# Patient Record
Sex: Female | Born: 1970 | Race: White | Hispanic: No | Marital: Married | Smoking: Never smoker
Health system: Southern US, Community
[De-identification: ages and names within clinical notes are randomized; demographics above are authoritative.]

## PROBLEM LIST (undated history)

## (undated) DIAGNOSIS — K8020 Calculus of gallbladder without cholecystitis without obstruction: Secondary | ICD-10-CM

## (undated) DIAGNOSIS — K219 Gastro-esophageal reflux disease without esophagitis: Secondary | ICD-10-CM

## (undated) HISTORY — PX: COLONOSCOPY: SHX174

## (undated) HISTORY — PX: OTHER SURGICAL HISTORY: SHX169

## (undated) HISTORY — PX: DILATION AND CURETTAGE OF UTERUS: SHX78

---

## 2017-08-04 DIAGNOSIS — D2262 Melanocytic nevi of left upper limb, including shoulder: Secondary | ICD-10-CM | POA: Diagnosis not present

## 2017-08-04 DIAGNOSIS — L814 Other melanin hyperpigmentation: Secondary | ICD-10-CM | POA: Diagnosis not present

## 2017-08-04 DIAGNOSIS — D225 Melanocytic nevi of trunk: Secondary | ICD-10-CM | POA: Diagnosis not present

## 2017-08-04 DIAGNOSIS — D2261 Melanocytic nevi of right upper limb, including shoulder: Secondary | ICD-10-CM | POA: Diagnosis not present

## 2018-05-15 DIAGNOSIS — H04123 Dry eye syndrome of bilateral lacrimal glands: Secondary | ICD-10-CM | POA: Diagnosis not present

## 2018-12-11 DIAGNOSIS — Z113 Encounter for screening for infections with a predominantly sexual mode of transmission: Secondary | ICD-10-CM | POA: Diagnosis not present

## 2018-12-11 DIAGNOSIS — N393 Stress incontinence (female) (male): Secondary | ICD-10-CM | POA: Diagnosis not present

## 2018-12-11 DIAGNOSIS — Z124 Encounter for screening for malignant neoplasm of cervix: Secondary | ICD-10-CM | POA: Diagnosis not present

## 2018-12-11 DIAGNOSIS — Z01419 Encounter for gynecological examination (general) (routine) without abnormal findings: Secondary | ICD-10-CM | POA: Diagnosis not present

## 2018-12-11 DIAGNOSIS — Z1151 Encounter for screening for human papillomavirus (HPV): Secondary | ICD-10-CM | POA: Diagnosis not present

## 2018-12-11 DIAGNOSIS — Z6824 Body mass index (BMI) 24.0-24.9, adult: Secondary | ICD-10-CM | POA: Diagnosis not present

## 2018-12-26 DIAGNOSIS — N393 Stress incontinence (female) (male): Secondary | ICD-10-CM | POA: Diagnosis not present

## 2018-12-26 DIAGNOSIS — M6289 Other specified disorders of muscle: Secondary | ICD-10-CM | POA: Diagnosis not present

## 2018-12-26 DIAGNOSIS — M6281 Muscle weakness (generalized): Secondary | ICD-10-CM | POA: Diagnosis not present

## 2018-12-26 DIAGNOSIS — N3941 Urge incontinence: Secondary | ICD-10-CM | POA: Diagnosis not present

## 2019-01-01 DIAGNOSIS — Z0000 Encounter for general adult medical examination without abnormal findings: Secondary | ICD-10-CM | POA: Diagnosis not present

## 2019-01-01 DIAGNOSIS — Z130 Encounter for screening for diseases of the blood and blood-forming organs and certain disorders involving the immune mechanism: Secondary | ICD-10-CM | POA: Diagnosis not present

## 2019-01-01 DIAGNOSIS — R1013 Epigastric pain: Secondary | ICD-10-CM | POA: Diagnosis not present

## 2019-01-01 DIAGNOSIS — Z13220 Encounter for screening for lipoid disorders: Secondary | ICD-10-CM | POA: Diagnosis not present

## 2019-01-01 DIAGNOSIS — Z1329 Encounter for screening for other suspected endocrine disorder: Secondary | ICD-10-CM | POA: Diagnosis not present

## 2019-01-01 DIAGNOSIS — Z131 Encounter for screening for diabetes mellitus: Secondary | ICD-10-CM | POA: Diagnosis not present

## 2019-01-03 DIAGNOSIS — M62838 Other muscle spasm: Secondary | ICD-10-CM | POA: Diagnosis not present

## 2019-01-03 DIAGNOSIS — M6281 Muscle weakness (generalized): Secondary | ICD-10-CM | POA: Diagnosis not present

## 2019-01-03 DIAGNOSIS — N3946 Mixed incontinence: Secondary | ICD-10-CM | POA: Diagnosis not present

## 2019-01-03 DIAGNOSIS — M6289 Other specified disorders of muscle: Secondary | ICD-10-CM | POA: Diagnosis not present

## 2019-01-09 DIAGNOSIS — M6281 Muscle weakness (generalized): Secondary | ICD-10-CM | POA: Diagnosis not present

## 2019-01-09 DIAGNOSIS — M62838 Other muscle spasm: Secondary | ICD-10-CM | POA: Diagnosis not present

## 2019-01-09 DIAGNOSIS — N3946 Mixed incontinence: Secondary | ICD-10-CM | POA: Diagnosis not present

## 2019-01-09 DIAGNOSIS — M6289 Other specified disorders of muscle: Secondary | ICD-10-CM | POA: Diagnosis not present

## 2019-01-17 DIAGNOSIS — M6289 Other specified disorders of muscle: Secondary | ICD-10-CM | POA: Diagnosis not present

## 2019-01-17 DIAGNOSIS — M62838 Other muscle spasm: Secondary | ICD-10-CM | POA: Diagnosis not present

## 2019-01-17 DIAGNOSIS — N3946 Mixed incontinence: Secondary | ICD-10-CM | POA: Diagnosis not present

## 2019-01-17 DIAGNOSIS — M6281 Muscle weakness (generalized): Secondary | ICD-10-CM | POA: Diagnosis not present

## 2019-02-07 DIAGNOSIS — N3946 Mixed incontinence: Secondary | ICD-10-CM | POA: Diagnosis not present

## 2019-02-07 DIAGNOSIS — M6281 Muscle weakness (generalized): Secondary | ICD-10-CM | POA: Diagnosis not present

## 2019-02-07 DIAGNOSIS — M62838 Other muscle spasm: Secondary | ICD-10-CM | POA: Diagnosis not present

## 2019-02-07 DIAGNOSIS — M6289 Other specified disorders of muscle: Secondary | ICD-10-CM | POA: Diagnosis not present

## 2019-03-05 DIAGNOSIS — Z309 Encounter for contraceptive management, unspecified: Secondary | ICD-10-CM | POA: Diagnosis not present

## 2019-07-19 ENCOUNTER — Other Ambulatory Visit: Payer: Self-pay

## 2019-07-19 DIAGNOSIS — Z20822 Contact with and (suspected) exposure to covid-19: Secondary | ICD-10-CM

## 2019-07-21 LAB — NOVEL CORONAVIRUS, NAA: SARS-CoV-2, NAA: NOT DETECTED

## 2020-03-06 ENCOUNTER — Other Ambulatory Visit: Payer: Self-pay | Admitting: Gastroenterology

## 2020-03-06 DIAGNOSIS — R1011 Right upper quadrant pain: Secondary | ICD-10-CM

## 2020-03-20 ENCOUNTER — Other Ambulatory Visit: Payer: Self-pay

## 2020-03-20 ENCOUNTER — Ambulatory Visit (HOSPITAL_COMMUNITY)
Admission: RE | Admit: 2020-03-20 | Discharge: 2020-03-20 | Disposition: A | Discharge status: Home / Self Care | Payer: 59 | Source: Ambulatory Visit | Attending: Gastroenterology | Admitting: Gastroenterology

## 2020-03-20 ENCOUNTER — Encounter (HOSPITAL_COMMUNITY): Admission: RE | Admit: 2020-03-20 | Payer: 59 | Source: Ambulatory Visit

## 2020-03-20 DIAGNOSIS — R1011 Right upper quadrant pain: Secondary | ICD-10-CM | POA: Insufficient documentation

## 2020-05-06 ENCOUNTER — Other Ambulatory Visit: Payer: Self-pay | Admitting: Surgery

## 2020-06-17 ENCOUNTER — Encounter (HOSPITAL_BASED_OUTPATIENT_CLINIC_OR_DEPARTMENT_OTHER): Payer: Self-pay | Admitting: Surgery

## 2020-06-17 ENCOUNTER — Other Ambulatory Visit: Payer: Self-pay

## 2020-06-19 ENCOUNTER — Other Ambulatory Visit (HOSPITAL_COMMUNITY)
Admission: RE | Admit: 2020-06-19 | Discharge: 2020-06-19 | Disposition: A | Discharge status: Home / Self Care | Payer: 59 | Source: Ambulatory Visit | Attending: Surgery | Admitting: Surgery

## 2020-06-19 DIAGNOSIS — Z20822 Contact with and (suspected) exposure to covid-19: Secondary | ICD-10-CM | POA: Insufficient documentation

## 2020-06-19 DIAGNOSIS — Z01812 Encounter for preprocedural laboratory examination: Secondary | ICD-10-CM | POA: Diagnosis present

## 2020-06-19 LAB — SARS CORONAVIRUS 2 (TAT 6-24 HRS): SARS Coronavirus 2: NEGATIVE

## 2020-06-19 MED ORDER — ENSURE PRE-SURGERY PO LIQD: 296.0000 mL | Freq: Once | ORAL | Status: DC

## 2020-06-19 NOTE — Progress Notes (Signed)

## 2020-06-22 NOTE — H&P (Signed)
   Gwenyth Allegra  Location: [facility] Patient #: [id number] DOB: 09/22/1970 Married / Language: English / Race: White Female   History of Present Illness  The patient is a 49 year old female who presents for evaluation of gall stones.  Chief complaint: Epigastric abdominal pain with symptomatic gallstones  This is a 49 year old female referred by Dr. Loreta Ave for symptomatic cholelithiasis. She has seen Dr. Loreta Ave for screening colonoscopy as she has just turned 49. On further questioning, she's been having epigastric abdominal pain radiated to the back with nausea and vomiting on occasion over the past year. She underwent an ultrasound showing multiple gallstones with the largest being 1.5 cm. The bile duct was normal. She has since had a negative upper and lower endoscopy. She reports attacks will last occasion several hours. Balance of otherwise been normal. She denies jaundice. She is otherwise healthy.   Past Surgical History No pertinent past surgical history   Diagnostic Studies History Colonoscopy  within last year Mammogram  within last year  Allergies  No Known Drug Allergies  [05/06/2020]: Allergies Reconciled   Medication History  Junel FE 1/20 (1-20MG -MCG Tablet, Oral) Active. Medications Reconciled  Social History  Caffeine use  Tea.  Family History Colon Polyps  Father.  Pregnancy / Birth History Gravida  2 Irregular periods  Length (months) of breastfeeding  7-12 Maternal age  34-35 Para  1  Other Problems  Cholelithiasis     Review of Systems  Breast Not Present- Breast Mass, Breast Pain, Nipple Discharge and Skin Changes. Cardiovascular Not Present- Chest Pain, Difficulty Breathing Lying Down, Leg Cramps, Palpitations, Rapid Heart Rate, Shortness of Breath and Swelling of Extremities. Gastrointestinal Not Present- Abdominal Pain, Bloating, Bloody Stool, Change in Bowel Habits, Chronic diarrhea, Constipation,  Difficulty Swallowing, Excessive gas, Gets full quickly at meals, Hemorrhoids, Indigestion, Nausea, Rectal Pain and Vomiting. Female Genitourinary Not Present- Frequency, Nocturia, Painful Urination, Pelvic Pain and Urgency. Musculoskeletal Not Present- Back Pain, Joint Pain, Joint Stiffness, Muscle Pain, Muscle Weakness and Swelling of Extremities. Psychiatric Not Present- Anxiety, Bipolar, Change in Sleep Pattern, Depression, Fearful and Frequent crying. Hematology Not Present- Blood Thinners, Easy Bruising, Excessive bleeding, Gland problems, HIV and Persistent Infections.  Vitals   Weight: 133.4 lb Height: 62in Body Surface Area: 1.61 m Body Mass Index: 24.4 kg/m  Temp.: 98.64F (Tympanic)  Pulse: 121 (Regular)  BP: 122/72(Sitting, Left Arm, Standard)     Physical Exam The physical exam findings are as follows: Note: Today, she appears well  Her abdomen is soft and nontender. There are no hernias. There is no hepatomegaly.    Assessment & Plan  SYMPTOMATIC CHOLELITHIASIS (K80.20)  Impression: I reviewed her notes from Dr. Loreta Ave. I reviewed her laboratory data and ultrasound. She does have multiple gallstones and her symptoms are consistent with symptomatic cholelithiasis. I diagnosis with her. A laparoscopic cholecystectomy is recommended. I discussed the procedure in detail. The patient was given Agricultural engineer. We discussed the risks and benefits of a laparoscopic cholecystectomy and possible cholangiogram including, but not limited to, bleeding, infection, injury to surrounding structures such as the intestine or liver, bile leak, retained gallstones, need to convert to an open procedure, prolonged diarrhea, blood clots such as DVT, common bile duct injury, anesthesia risks, and possible need for additional procedures. The likelihood of improvement in symptoms and return to the patient's normal status is good. We discussed the typical post-operative recovery  course. All questions were answered.

## 2020-06-23 ENCOUNTER — Ambulatory Visit (HOSPITAL_BASED_OUTPATIENT_CLINIC_OR_DEPARTMENT_OTHER): Payer: 59 | Admitting: Certified Registered"

## 2020-06-23 ENCOUNTER — Encounter (HOSPITAL_BASED_OUTPATIENT_CLINIC_OR_DEPARTMENT_OTHER): Payer: Self-pay | Admitting: Surgery

## 2020-06-23 ENCOUNTER — Encounter (HOSPITAL_BASED_OUTPATIENT_CLINIC_OR_DEPARTMENT_OTHER)
Admission: RE | Disposition: A | Discharge status: Home / Self Care | Payer: Self-pay | Source: Home / Self Care | Attending: Surgery

## 2020-06-23 ENCOUNTER — Ambulatory Visit (HOSPITAL_BASED_OUTPATIENT_CLINIC_OR_DEPARTMENT_OTHER)
Admission: RE | Admit: 2020-06-23 | Discharge: 2020-06-23 | Disposition: A | Discharge status: Home / Self Care | Payer: 59 | Attending: Surgery | Admitting: Surgery

## 2020-06-23 ENCOUNTER — Other Ambulatory Visit: Payer: Self-pay

## 2020-06-23 DIAGNOSIS — K8010 Calculus of gallbladder with chronic cholecystitis without obstruction: Secondary | ICD-10-CM | POA: Insufficient documentation

## 2020-06-23 HISTORY — DX: Gastro-esophageal reflux disease without esophagitis: K21.9

## 2020-06-23 HISTORY — PX: CHOLECYSTECTOMY: SHX55

## 2020-06-23 HISTORY — DX: Calculus of gallbladder without cholecystitis without obstruction: K80.20

## 2020-06-23 LAB — POCT PREGNANCY, URINE: Preg Test, Ur: NEGATIVE

## 2020-06-23 SURGERY — LAPAROSCOPIC CHOLECYSTECTOMY
Anesthesia: General | Site: Abdomen

## 2020-06-23 MED ORDER — DEXAMETHASONE SODIUM PHOSPHATE 10 MG/ML IJ SOLN
INTRAMUSCULAR | Status: AC
  Filled 2020-06-23: qty 1

## 2020-06-23 MED ORDER — HYDROMORPHONE HCL 1 MG/ML IJ SOLN
INTRAMUSCULAR | Status: AC
  Filled 2020-06-23: qty 0.5

## 2020-06-23 MED ORDER — ROCURONIUM BROMIDE 100 MG/10ML IV SOLN
INTRAVENOUS | Status: DC | PRN
  Administered 2020-06-23: 60 mg via INTRAVENOUS

## 2020-06-23 MED ORDER — LIDOCAINE 2% (20 MG/ML) 5 ML SYRINGE
INTRAMUSCULAR | Status: AC
  Filled 2020-06-23: qty 5

## 2020-06-23 MED ORDER — PROMETHAZINE HCL 25 MG/ML IJ SOLN: 6.2500 mg | INTRAMUSCULAR | Status: DC | PRN

## 2020-06-23 MED ORDER — MIDAZOLAM HCL 5 MG/5ML IJ SOLN
INTRAMUSCULAR | Status: DC | PRN
  Administered 2020-06-23: 2 mg via INTRAVENOUS

## 2020-06-23 MED ORDER — GABAPENTIN 300 MG PO CAPS
300.0000 mg | ORAL_CAPSULE | ORAL | Status: AC
  Administered 2020-06-23: 300 mg via ORAL

## 2020-06-23 MED ORDER — SUGAMMADEX SODIUM 500 MG/5ML IV SOLN
INTRAVENOUS | Status: AC
  Filled 2020-06-23: qty 5

## 2020-06-23 MED ORDER — ONDANSETRON HCL 4 MG/2ML IJ SOLN
INTRAMUSCULAR | Status: AC
  Filled 2020-06-23: qty 2

## 2020-06-23 MED ORDER — 0.9 % SODIUM CHLORIDE (POUR BTL) OPTIME
TOPICAL | Status: DC | PRN
  Administered 2020-06-23: 1000 mL

## 2020-06-23 MED ORDER — OXYCODONE HCL 5 MG/5ML PO SOLN: 5.0000 mg | Freq: Once | ORAL | Status: DC | PRN

## 2020-06-23 MED ORDER — CHLORHEXIDINE GLUCONATE CLOTH 2 % EX PADS: 6.0000 | MEDICATED_PAD | Freq: Once | CUTANEOUS | Status: DC

## 2020-06-23 MED ORDER — MEPERIDINE HCL 25 MG/ML IJ SOLN: 6.2500 mg | INTRAMUSCULAR | Status: DC | PRN

## 2020-06-23 MED ORDER — ACETAMINOPHEN 500 MG PO TABS
1000.0000 mg | ORAL_TABLET | ORAL | Status: AC
  Administered 2020-06-23: 1000 mg via ORAL

## 2020-06-23 MED ORDER — SUGAMMADEX SODIUM 200 MG/2ML IV SOLN
INTRAVENOUS | Status: DC | PRN
  Administered 2020-06-23: 250 mg via INTRAVENOUS

## 2020-06-23 MED ORDER — AMISULPRIDE (ANTIEMETIC) 5 MG/2ML IV SOLN: 10.0000 mg | Freq: Once | INTRAVENOUS | Status: DC | PRN

## 2020-06-23 MED ORDER — ONDANSETRON HCL 4 MG/2ML IJ SOLN
INTRAMUSCULAR | Status: DC | PRN
  Administered 2020-06-23: 4 mg via INTRAVENOUS

## 2020-06-23 MED ORDER — SODIUM CHLORIDE 0.9 % IR SOLN
Status: DC | PRN
  Administered 2020-06-23: 1000 mL

## 2020-06-23 MED ORDER — PHENYLEPHRINE HCL (PRESSORS) 10 MG/ML IV SOLN
INTRAVENOUS | Status: DC | PRN
  Administered 2020-06-23 (×2): 80 ug via INTRAVENOUS

## 2020-06-23 MED ORDER — PHENYLEPHRINE 40 MCG/ML (10ML) SYRINGE FOR IV PUSH (FOR BLOOD PRESSURE SUPPORT)
PREFILLED_SYRINGE | INTRAVENOUS | Status: AC
  Filled 2020-06-23: qty 10

## 2020-06-23 MED ORDER — LIDOCAINE 2% (20 MG/ML) 5 ML SYRINGE
INTRAMUSCULAR | Status: DC | PRN
  Administered 2020-06-23: 60 mg via INTRAVENOUS

## 2020-06-23 MED ORDER — GABAPENTIN 300 MG PO CAPS
ORAL_CAPSULE | ORAL | Status: AC
  Filled 2020-06-23: qty 1

## 2020-06-23 MED ORDER — PROPOFOL 10 MG/ML IV BOLUS
INTRAVENOUS | Status: DC | PRN
  Administered 2020-06-23: 120 mg via INTRAVENOUS

## 2020-06-23 MED ORDER — DEXAMETHASONE SODIUM PHOSPHATE 10 MG/ML IJ SOLN
INTRAMUSCULAR | Status: DC | PRN
  Administered 2020-06-23: 5 mg via INTRAVENOUS

## 2020-06-23 MED ORDER — CELECOXIB 200 MG PO CAPS
ORAL_CAPSULE | ORAL | Status: AC
  Filled 2020-06-23: qty 2

## 2020-06-23 MED ORDER — FENTANYL CITRATE (PF) 100 MCG/2ML IJ SOLN
INTRAMUSCULAR | Status: AC
  Filled 2020-06-23: qty 2

## 2020-06-23 MED ORDER — FENTANYL CITRATE (PF) 100 MCG/2ML IJ SOLN
INTRAMUSCULAR | Status: DC | PRN
  Administered 2020-06-23: 100 ug via INTRAVENOUS
  Administered 2020-06-23: 50 ug via INTRAVENOUS
  Administered 2020-06-23 (×2): 25 ug via INTRAVENOUS

## 2020-06-23 MED ORDER — CEFAZOLIN SODIUM-DEXTROSE 2-4 GM/100ML-% IV SOLN
INTRAVENOUS | Status: AC
  Filled 2020-06-23: qty 100

## 2020-06-23 MED ORDER — OXYCODONE HCL 5 MG PO TABS: 5.0000 mg | ORAL_TABLET | Freq: Once | ORAL | Status: DC | PRN

## 2020-06-23 MED ORDER — CEFAZOLIN SODIUM-DEXTROSE 2-4 GM/100ML-% IV SOLN
2.0000 g | INTRAVENOUS | Status: AC
  Administered 2020-06-23: 2 g via INTRAVENOUS

## 2020-06-23 MED ORDER — ROCURONIUM BROMIDE 10 MG/ML (PF) SYRINGE
PREFILLED_SYRINGE | INTRAVENOUS | Status: AC
  Filled 2020-06-23: qty 10

## 2020-06-23 MED ORDER — LACTATED RINGERS IV SOLN: INTRAVENOUS | Status: DC

## 2020-06-23 MED ORDER — CELECOXIB 200 MG PO CAPS
400.0000 mg | ORAL_CAPSULE | ORAL | Status: AC
  Administered 2020-06-23: 400 mg via ORAL

## 2020-06-23 MED ORDER — HYDROMORPHONE HCL 1 MG/ML IJ SOLN
0.2500 mg | INTRAMUSCULAR | Status: DC | PRN
  Administered 2020-06-23 (×2): 0.5 mg via INTRAVENOUS

## 2020-06-23 MED ORDER — ACETAMINOPHEN 500 MG PO TABS
ORAL_TABLET | ORAL | Status: AC
  Filled 2020-06-23: qty 2

## 2020-06-23 MED ORDER — BUPIVACAINE HCL 0.25 % IJ SOLN
INTRAMUSCULAR | Status: DC | PRN
  Administered 2020-06-23: 20 mL

## 2020-06-23 MED ORDER — OXYCODONE HCL 5 MG PO TABS: 5.0000 mg | ORAL_TABLET | Freq: Four times a day (QID) | ORAL | 0 refills | Status: AC | PRN

## 2020-06-23 MED ORDER — MIDAZOLAM HCL 2 MG/2ML IJ SOLN
INTRAMUSCULAR | Status: AC
  Filled 2020-06-23: qty 2

## 2020-06-23 SURGICAL SUPPLY — 36 items
APPLIER CLIP 5 13 M/L LIGAMAX5 (MISCELLANEOUS) ×3
BLADE CLIPPER SURG (BLADE) IMPLANT
CHLORAPREP W/TINT 26 (MISCELLANEOUS) ×3 IMPLANT
CLIP APPLIE 5 13 M/L LIGAMAX5 (MISCELLANEOUS) ×1 IMPLANT
COVER MAYO STAND STRL (DRAPES) IMPLANT
COVER WAND RF STERILE (DRAPES) IMPLANT
DECANTER SPIKE VIAL GLASS SM (MISCELLANEOUS) ×3 IMPLANT
DERMABOND ADVANCED (GAUZE/BANDAGES/DRESSINGS) ×2
DERMABOND ADVANCED .7 DNX12 (GAUZE/BANDAGES/DRESSINGS) ×1 IMPLANT
DRAPE C-ARM 42X72 X-RAY (DRAPES) IMPLANT
DRAPE LAPAROSCOPIC ABDOMINAL (DRAPES) ×3 IMPLANT
ELECT REM PT RETURN 9FT ADLT (ELECTROSURGICAL) ×3
ELECTRODE REM PT RTRN 9FT ADLT (ELECTROSURGICAL) ×1 IMPLANT
GLOVE SURG SIGNA 7.5 PF LTX (GLOVE) ×3 IMPLANT
GOWN STRL REUS W/ TWL LRG LVL3 (GOWN DISPOSABLE) ×2 IMPLANT
GOWN STRL REUS W/ TWL XL LVL3 (GOWN DISPOSABLE) ×1 IMPLANT
GOWN STRL REUS W/TWL LRG LVL3 (GOWN DISPOSABLE) ×4
GOWN STRL REUS W/TWL XL LVL3 (GOWN DISPOSABLE) ×2
NS IRRIG 1000ML POUR BTL (IV SOLUTION) ×3 IMPLANT
PACK BASIN DAY SURGERY FS (CUSTOM PROCEDURE TRAY) ×3 IMPLANT
POUCH SPECIMEN RETRIEVAL 10MM (ENDOMECHANICALS) ×3 IMPLANT
SCISSORS LAP 5X35 DISP (ENDOMECHANICALS) ×3 IMPLANT
SET CHOLANGIOGRAPH 5 50 .035 (SET/KITS/TRAYS/PACK) IMPLANT
SET IRRIG TUBING LAPAROSCOPIC (IRRIGATION / IRRIGATOR) ×3 IMPLANT
SET TUBE SMOKE EVAC HIGH FLOW (TUBING) ×3 IMPLANT
SLEEVE ENDOPATH XCEL 5M (ENDOMECHANICALS) ×6 IMPLANT
SLEEVE SCD COMPRESS KNEE MED (MISCELLANEOUS) ×3 IMPLANT
SPECIMEN JAR SMALL (MISCELLANEOUS) ×3 IMPLANT
SUT MNCRL AB 4-0 PS2 18 (SUTURE) ×3 IMPLANT
SUT MON AB 4-0 PC3 18 (SUTURE) ×3 IMPLANT
TOWEL GREEN STERILE FF (TOWEL DISPOSABLE) ×3 IMPLANT
TRAY LAPAROSCOPIC (CUSTOM PROCEDURE TRAY) ×3 IMPLANT
TROCAR XCEL BLUNT TIP 100MML (ENDOMECHANICALS) ×3 IMPLANT
TROCAR XCEL NON-BLD 5MMX100MML (ENDOMECHANICALS) ×3 IMPLANT
TUBE CONNECTING 20'X1/4 (TUBING) ×1
TUBE CONNECTING 20X1/4 (TUBING) ×2 IMPLANT

## 2020-06-23 NOTE — Interval H&P Note (Signed)
History and Physical Interval Note: no change in H and P  06/23/2020 8:50 AM  Breanna Hunt  has presented today for surgery, with the diagnosis of SYMPTOMATIC CHOLELITHIASIS.  The various methods of treatment have been discussed with the patient and family. After consideration of risks, benefits and other options for treatment, the patient has consented to  Procedure(s): LAPAROSCOPIC CHOLECYSTECTOMY (N/A) as a surgical intervention.  The patient's history has been reviewed, patient examined, no change in status, stable for surgery.  I have reviewed the patient's chart and labs.  Questions were answered to the patient's satisfaction.     Abigail Miyamoto

## 2020-06-23 NOTE — Transfer of Care (Signed)
Immediate Anesthesia Transfer of Care Note  Patient: Breanna Hunt  Procedure(s) Performed: LAPAROSCOPIC CHOLECYSTECTOMY (N/A Abdomen)  Patient Location: PACU  Anesthesia Type:General  Level of Consciousness: drowsy  Airway & Oxygen Therapy: Patient Spontanous Breathing and Patient connected to face mask oxygen  Post-op Assessment: Report given to RN and Post -op Vital signs reviewed and stable  Post vital signs: Reviewed and stable  Last Vitals:  Vitals Value Taken Time  BP 145/89 06/23/20 1031  Temp    Pulse 81 06/23/20 1033  Resp 24 06/23/20 1033  SpO2 100 % 06/23/20 1033  Vitals shown include unvalidated device data.  Last Pain:  Vitals:   06/23/20 0738  TempSrc:   PainSc: 0-No pain         Complications: No complications documented.

## 2020-06-23 NOTE — Discharge Instructions (Signed)
Next dose of Tylenol and Ibuprofen at 1:45 PM   [facility] ______[facility] LAPAROSCOPIC SURGERY: POST OP INSTRUCTIONS Always review your discharge instruction sheet given to you by the facility where your surgery was performed. IF YOU HAVE DISABILITY OR FAMILY LEAVE FORMS, YOU MUST BRING THEM TO THE OFFICE FOR PROCESSING.   DO NOT GIVE THEM TO YOUR DOCTOR.  1. A prescription for pain medication may be given to you upon discharge.  Take your pain medication as prescribed, if needed.  If narcotic pain medicine is not needed, then you may take acetaminophen (Tylenol) or ibuprofen (Advil) as needed. 2. Take your usually prescribed medications unless otherwise directed. 3. If you need a refill on your pain medication, please contact your pharmacy.  They will contact our office to request authorization. Prescriptions will not be filled after 5pm or on week-ends. 4. You should follow a light diet the first few days after arrival home, such as soup and crackers, etc.  Be sure to include lots of fluids daily. 5. Most patients will experience some swelling and bruising in the area of the incisions.  Ice packs will help.  Swelling and bruising can take several days to resolve.  6. It is common to experience some constipation if taking pain medication after surgery.  Increasing fluid intake and taking a stool softener (such as Colace) will usually help or prevent this problem from occurring.  A mild laxative (Milk of Magnesia or Miralax) should be taken according to package instructions if there are no bowel movements after 48 hours. 7. Unless discharge instructions indicate otherwise, you may remove your bandages 24-48 hours after surgery, and you may shower at that time.  You may have steri-strips (small skin tapes) in place directly over the incision.  These strips should be left on the skin for 7-10 days.  If your surgeon used skin glue on the incision, you may shower in 24 hours.  The glue will  flake off over the next 2-3 weeks.  Any sutures or staples will be removed at the office during your follow-up visit. 8. ACTIVITIES:  You may resume regular (light) daily activities beginning the next day--such as daily self-care, walking, climbing stairs--gradually increasing activities as tolerated.  You may have sexual intercourse when it is comfortable.  Refrain from any heavy lifting or straining until approved by your doctor. a. You may drive when you are no longer taking prescription pain medication, you can comfortably wear a seatbelt, and you can safely maneuver your car and apply brakes. b. RETURN TO WORK:  __________________________________________________________ 9. You should see your doctor in the office for a follow-up appointment approximately 2-3 weeks after your surgery.  Make sure that you call for this appointment within a day or two after you arrive home to insure a convenient appointment time. 10. OTHER INSTRUCTIONS:OK TO SHOWER STARTING TOMORROW 11. ICE PACK, TYLENOL, AND IBUPROFEN ALSO FOR PAIN 12. NO LIFTING MORE THAN 15 TO 20 POUNDS FOR 2 WEEKS __________________________________________________________________________________________________________________________ __________________________________________________________________________________________________________________________ WHEN TO CALL YOUR DOCTOR: 1. Fever over 101.0 2. Inability to urinate 3. Continued bleeding from incision. 4. Increased pain, redness, or drainage from the incision. 5. Increasing abdominal pain  The clinic staff is available to answer your questions during regular business hours.  Please don't hesitate to call and ask to speak to one of the nurses for clinical concerns.  If you have a medical emergency, go to the nearest emergency room or call 911.  A surgeon from [facility]  is always on call at the hospital. [address], [place], [place]  [address] ? [address]  [address], [place], [place]   [address] [phone] ? [phone] ? FAX [phone] Web site: [email or web address]    Post Anesthesia Home Care Instructions  Activity: Get plenty of rest for the remainder of the day. A responsible individual must stay with you for 24 hours following the procedure.  For the next 24 hours, DO NOT: -Drive a car -Advertising copywriter -Drink alcoholic beverages -Take any medication unless instructed by your physician -Make any legal decisions or sign important papers.  Meals: Start with liquid foods such as gelatin or soup. Progress to regular foods as tolerated. Avoid greasy, spicy, heavy foods. If nausea and/or vomiting occur, drink only clear liquids until the nausea and/or vomiting subsides. Call your physician if vomiting continues.  Special Instructions/Symptoms: Your throat may feel dry or sore from the anesthesia or the breathing tube placed in your throat during surgery. If this causes discomfort, gargle with warm salt water. The discomfort should disappear within 24 hours.  If you had a scopolamine patch placed behind your ear for the management of post- operative nausea and/or vomiting:  1. The medication in the patch is effective for 72 hours, after which it should be removed.  Wrap patch in a tissue and discard in the trash. Wash hands thoroughly with soap and water. 2. You may remove the patch earlier than 72 hours if you experience unpleasant side effects which may include dry mouth, dizziness or visual disturbances. 3. Avoid touching the patch. Wash your hands with soap and water after contact with the patch.

## 2020-06-23 NOTE — Op Note (Signed)
Laparoscopic Cholecystectomy Procedure Note  Indications: This patient presents with symptomatic gallbladder disease and will undergo laparoscopic cholecystectomy.  Pre-operative Diagnosis: Symptomatic cholelithiasis   Post-operative Diagnosis: Symptomatic cholelithiasis   Surgeon: Abigail Miyamoto MD  Assistants: Kerin Salen MD [facility]  Anesthesia: General endotracheal anesthesia  ASA Class: 2  Procedure Details  The patient was seen again in the Holding Room. The risks, benefits, complications, treatment options, and expected outcomes were discussed with the patient. The possibilities of reaction to medication, pulmonary aspiration, perforation of viscus, bleeding, recurrent infection, finding a normal gallbladder, the need for additional procedures, failure to diagnose a condition, the possible need to convert to an open procedure, and creating a complication requiring transfusion or operation were discussed with the patient. The likelihood of improving the patient's symptoms with return to their baseline status is good.  The patient and/or family concurred with the proposed plan, giving informed consent. The site of surgery properly noted. The patient was taken to Operating Room, identified as Breanna Hunt and the procedure verified as Laparoscopic Cholecystectomy with Intraoperative Cholangiogram. A Time Out was held and the above information confirmed.  Prior to the induction of general anesthesia, antibiotic prophylaxis was administered. General endotracheal anesthesia was then administered and tolerated well. After the induction, the abdomen was prepped with Chloraprep and draped in sterile fashion. The patient was positioned in the supine position.  Local anesthetic agent was injected into the skin near the umbilicus and an incision made. We dissected down to the abdominal fascia with blunt dissection.  The fascia was incised vertically and we entered the peritoneal cavity  bluntly.  A pursestring suture of 0-Vicryl was placed around the fascial opening.  The Hasson cannula was inserted and secured with the stay suture.  Pneumoperitoneum was then created with CO2 and tolerated well without any adverse changes in the patient's vital signs. A 5-mm port was placed in the subxiphoid position.  Two 5-mm ports were placed in the right upper quadrant. All skin incisions were infiltrated with a local anesthetic agent before making the incision and placing the trocars.   We positioned the patient in reverse Trendelenburg, tilted slightly to the patient's left.  The gallbladder was identified, the fundus grasped and retracted cephalad. Adhesions were lysed bluntly and with the electrocautery where indicated, taking care not to injure any adjacent organs or viscus. The infundibulum was grasped and retracted laterally, exposing the peritoneum overlying the triangle of Calot. This was then divided and exposed in a blunt fashion. The cystic duct was clearly identified and bluntly dissected circumferentially. A critical view of the cystic duct and cystic artery was obtained.  The cystic duct was then ligated with clips and divided. The cystic artery was, dissected free, ligated with clips and divided as well.   The gallbladder was dissected from the liver bed in retrograde fashion with the electrocautery. The gallbladder was removed and placed in an Endocatch sac. The liver bed was irrigated and inspected. Hemostasis was achieved with the electrocautery. Copious irrigation was utilized and was repeatedly aspirated until clear.  The gallbladder and Endocatch sac were then removed through the umbilical port site.  The pursestring suture was used to close the umbilical fascia.    We again inspected the right upper quadrant for hemostasis.  Pneumoperitoneum was released as we removed the trocars.  4-0 Monocryl was used to close the skin. Skin glue was then applied. The patient was then extubated  and brought to the recovery room in stable condition. Instrument,  sponge, and needle counts were correct at closure and at the conclusion of the case.   Findings: Chronic Cholecystitis with Cholelithiasis  Estimated Blood Loss: Minimal         Drains: none         Specimens: Gallbladder           Complications: None; patient tolerated the procedure well.         Disposition: PACU - hemodynamically stable.         Condition: stable

## 2020-06-23 NOTE — Anesthesia Preprocedure Evaluation (Signed)
Anesthesia Evaluation  Patient identified by MRN, date of birth, ID band Patient awake    Reviewed: Allergy & Precautions, NPO status , Patient's Chart, lab work & pertinent test results  Airway Mallampati: II  TM Distance: >3 FB Neck ROM: Full    Dental no notable dental hx.    Pulmonary neg pulmonary ROS,    Pulmonary exam normal breath sounds clear to auscultation       Cardiovascular negative cardio ROS Normal cardiovascular exam Rhythm:Regular Rate:Normal     Neuro/Psych negative neurological ROS  negative psych ROS   GI/Hepatic Neg liver ROS, GERD  ,  Endo/Other  negative endocrine ROS  Renal/GU negative Renal ROS  negative genitourinary   Musculoskeletal negative musculoskeletal ROS (+)   Abdominal   Peds negative pediatric ROS (+)  Hematology negative hematology ROS (+)   Anesthesia Other Findings   Reproductive/Obstetrics negative OB ROS                             Anesthesia Physical Anesthesia Plan  ASA: II  Anesthesia Plan: General   Post-op Pain Management:    Induction: Intravenous  PONV Risk Score and Plan: 3 and Ondansetron, Dexamethasone, Midazolam and Treatment may vary due to age or medical condition  Airway Management Planned: Oral ETT  Additional Equipment:   Intra-op Plan:   Post-operative Plan: Extubation in OR  Informed Consent: I have reviewed the patients History and Physical, chart, labs and discussed the procedure including the risks, benefits and alternatives for the proposed anesthesia with the patient or authorized representative who has indicated his/her understanding and acceptance.     Dental advisory given  Plan Discussed with: CRNA  Anesthesia Plan Comments:         Anesthesia Quick Evaluation  

## 2020-06-23 NOTE — Anesthesia Procedure Notes (Signed)
Procedure Name: Intubation Date/Time: 06/23/2020 9:21 AM Performed by: Lavonia Dana, CRNA Pre-anesthesia Checklist: Patient identified, Emergency Drugs available, Suction available and Patient being monitored Patient Re-evaluated:Patient Re-evaluated prior to induction Oxygen Delivery Method: Circle system utilized Preoxygenation: Pre-oxygenation with 100% oxygen Induction Type: IV induction Ventilation: Mask ventilation without difficulty Laryngoscope Size: Mac and 3 Grade View: Grade I Tube type: Oral Tube size: 7.0 mm Number of attempts: 1 Airway Equipment and Method: Stylet and Bite block Placement Confirmation: ETT inserted through vocal cords under direct vision,  positive ETCO2 and breath sounds checked- equal and bilateral Secured at: 21 cm Tube secured with: Tape Dental Injury: Teeth and Oropharynx as per pre-operative assessment

## 2020-06-23 NOTE — Anesthesia Postprocedure Evaluation (Signed)
Anesthesia Post Note  Patient: Breanna Hunt  Procedure(s) Performed: LAPAROSCOPIC CHOLECYSTECTOMY (N/A Abdomen)     Patient location during evaluation: PACU Anesthesia Type: General Level of consciousness: awake and alert Pain management: pain level controlled Vital Signs Assessment: post-procedure vital signs reviewed and stable Respiratory status: spontaneous breathing, nonlabored ventilation and respiratory function stable Cardiovascular status: blood pressure returned to baseline and stable Postop Assessment: no apparent nausea or vomiting Anesthetic complications: no   No complications documented.  Last Vitals:  Vitals:   06/23/20 1145 06/23/20 1200  BP: 138/87 137/76  Pulse: 78 80  Resp: (!) 25 (!) 25  Temp:    SpO2: 100% 95%    Last Pain:  Vitals:   06/23/20 1200  TempSrc:   PainSc: 4                  Lowella Curb

## 2020-06-24 ENCOUNTER — Encounter (HOSPITAL_BASED_OUTPATIENT_CLINIC_OR_DEPARTMENT_OTHER): Payer: Self-pay | Admitting: Surgery

## 2020-06-24 LAB — SURGICAL PATHOLOGY

## 2021-05-24 IMAGING — US US ABDOMEN LIMITED
1 series · 14 of 25 positions shown · non-contrast
Comparison: None.

CLINICAL DATA: Right upper quadrant abdominal pain intermittently
for 6 months.

EXAM:
ULTRASOUND ABDOMEN LIMITED RIGHT UPPER QUADRANT

[Series 1: us abdomen limited · 14 of 60 slices shown]
[im 1/60]
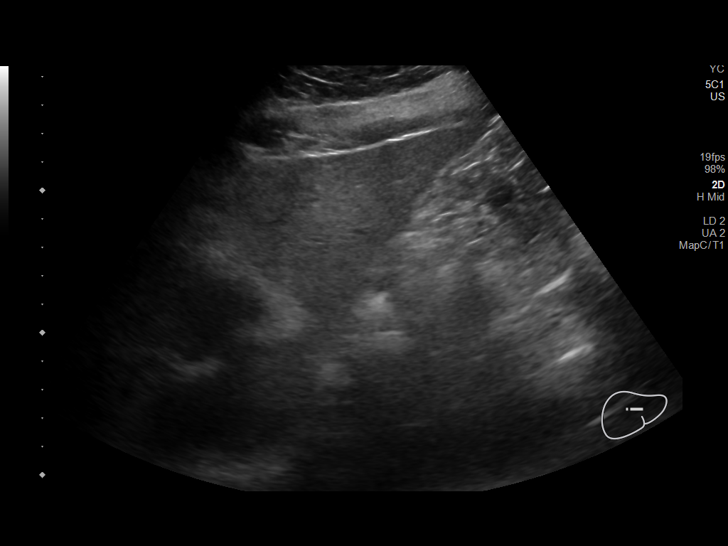
[im 5/60]
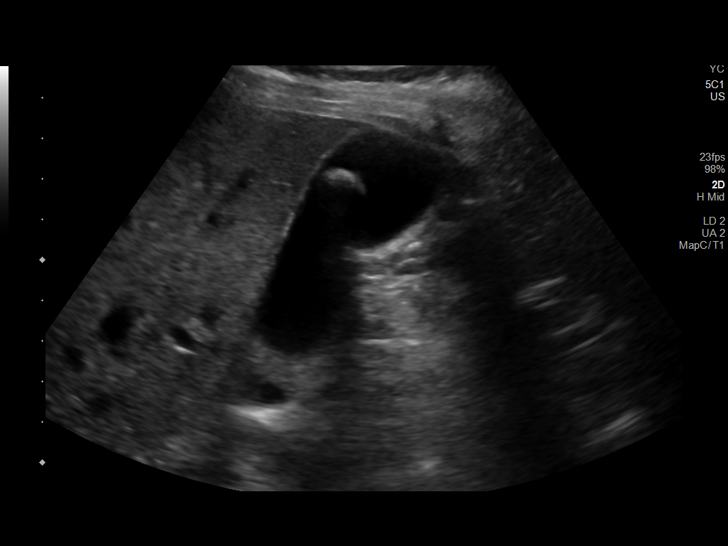
[im 10/60]
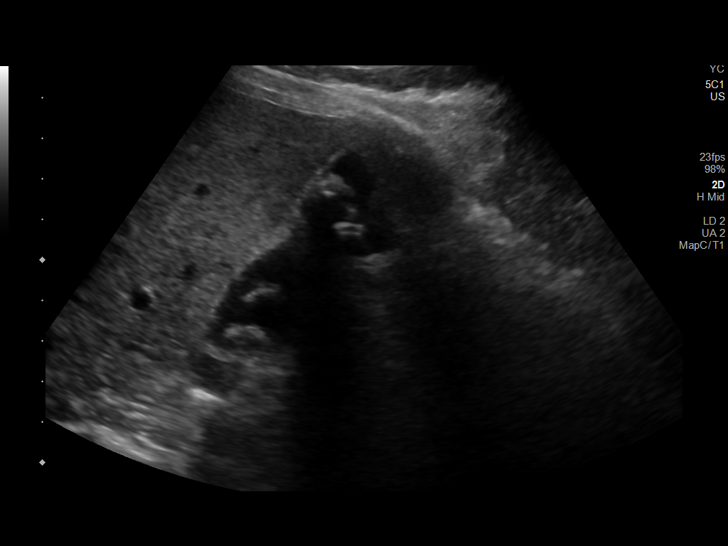
[im 15/60]
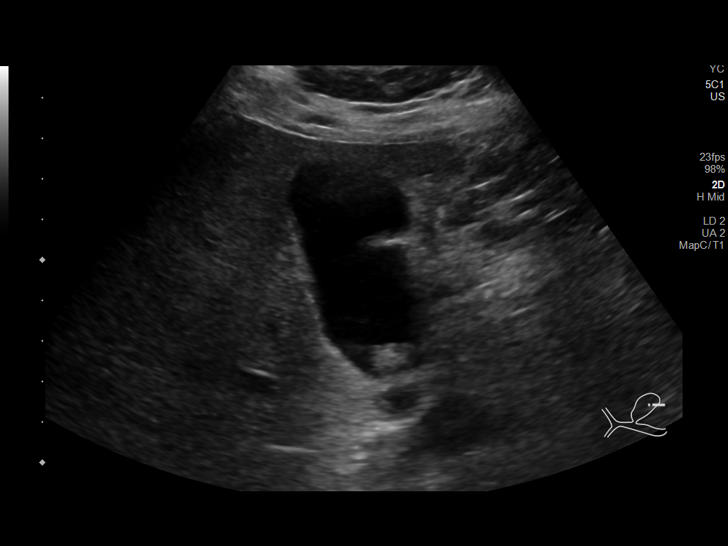
[im 20/60]
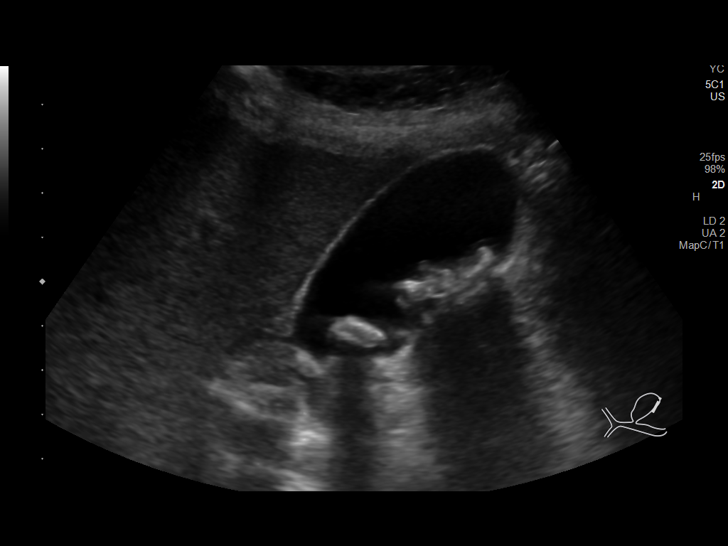
[im 23/60]
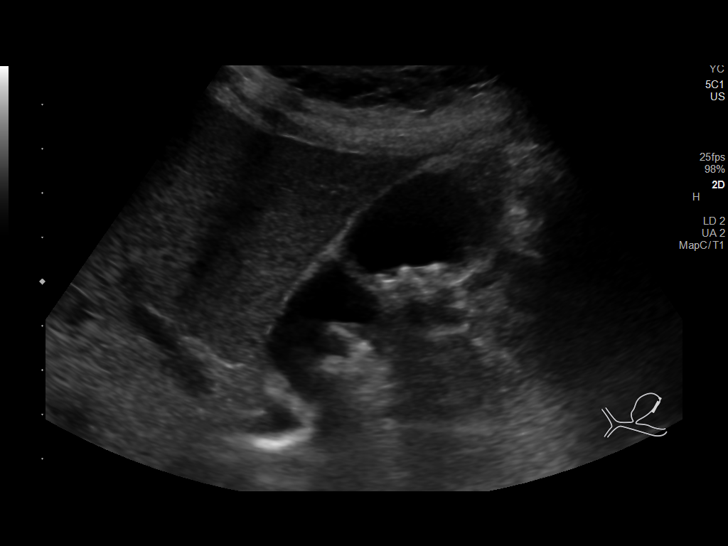
[im 28/60]
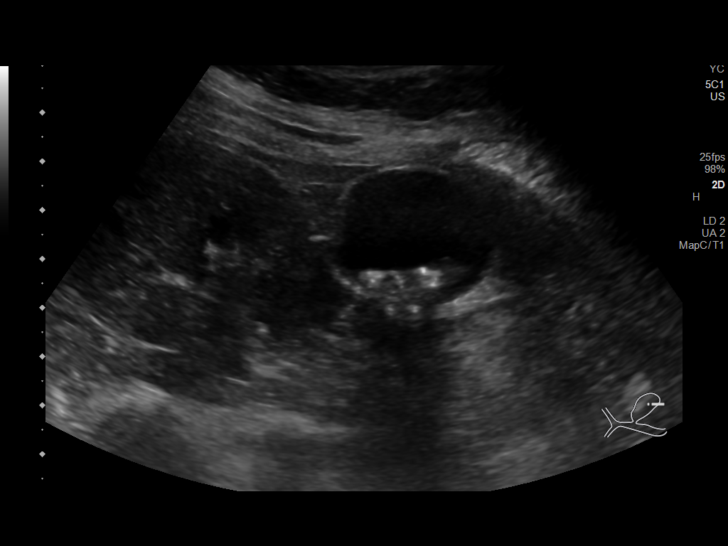
[im 32/60]
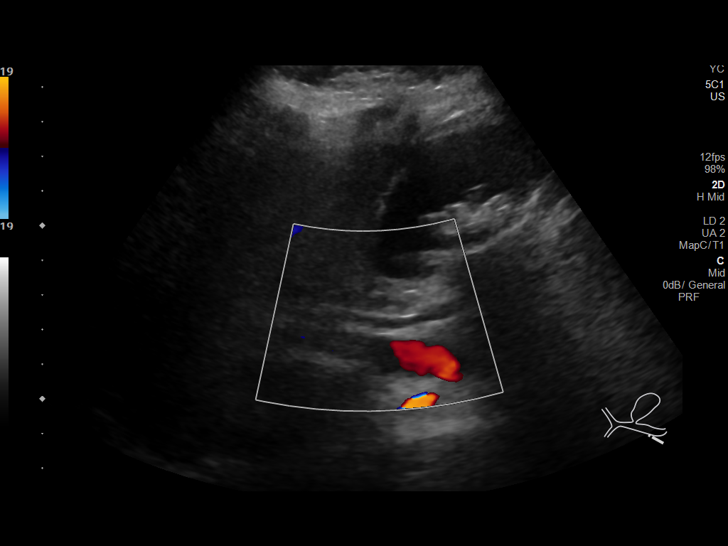
[im 37/60]
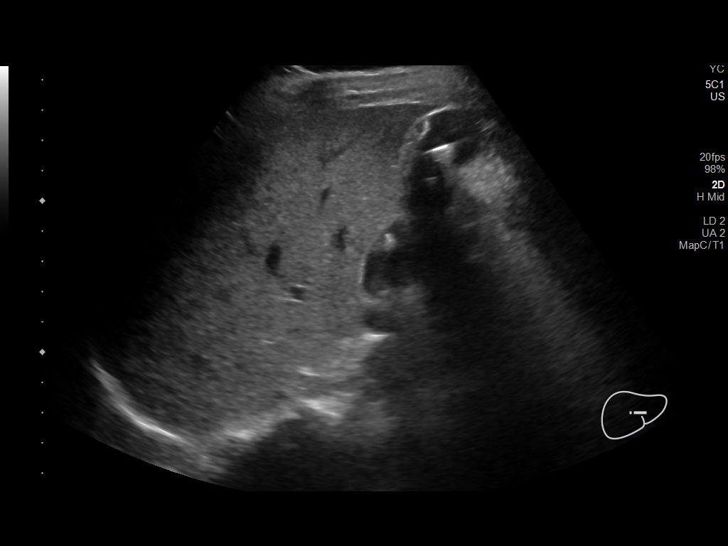
[im 40/60]
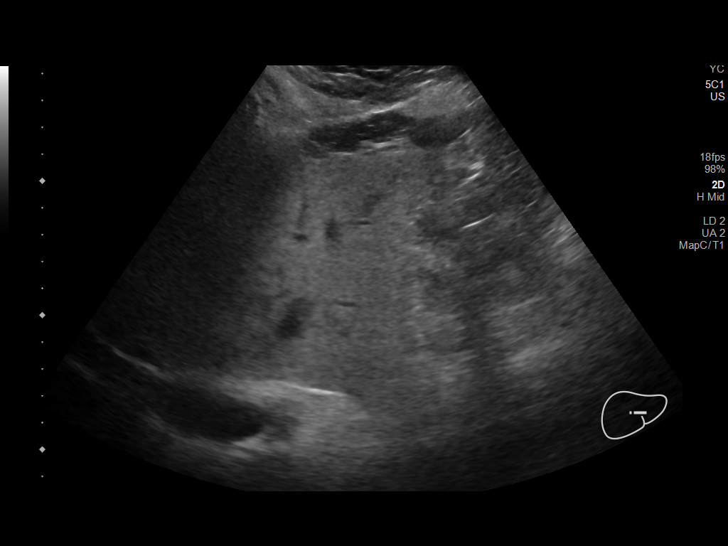
[im 45/60]
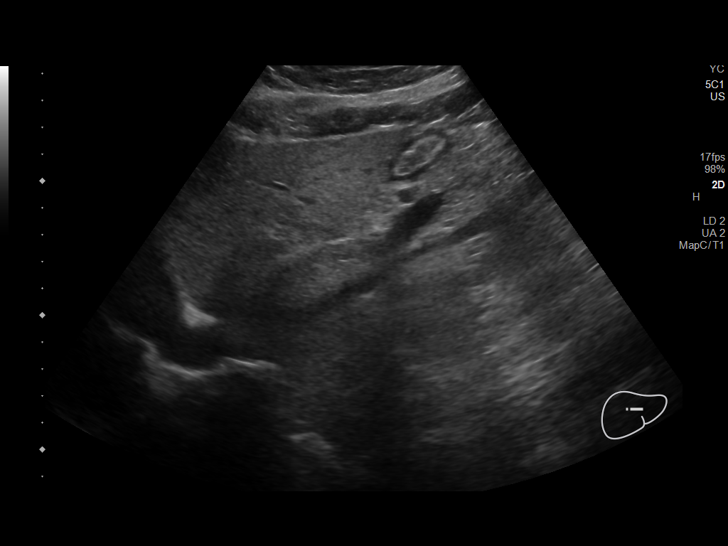
[im 50/60]
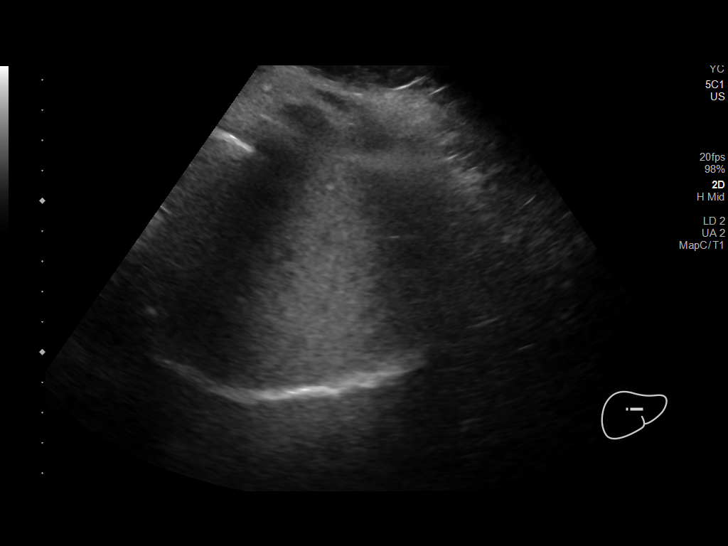
[im 55/60]
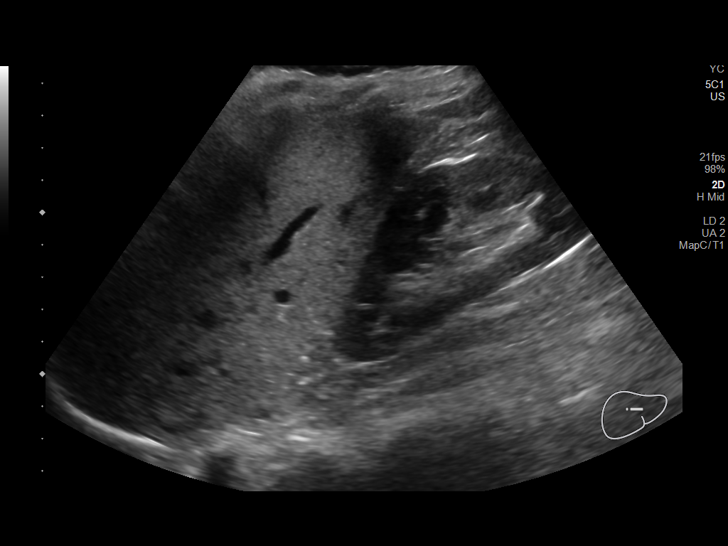
[im 60/60]
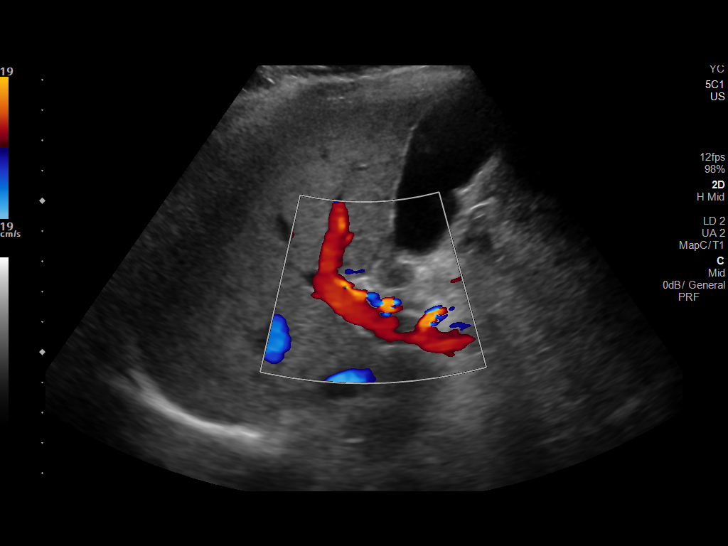

[14 of 25 positions shown; findings below may reference images not displayed]

FINDINGS: Gallbladder:

Numerous echogenic shadowing gallstones are noted in the
gallbladder. The largest calculus measures 1.5 cm. No gallbladder
wall thickening, pericholecystic fluid or sonographic Murphy sign to
suggest acute cholecystitis.

Common bile duct:

Diameter: 3.6 mm

Liver:

Normal echogenicity without focal lesion or biliary dilatation.
Portal vein is patent on color Doppler imaging with normal direction
of blood flow towards the liver.

Other: None.
IMPRESSION: 1. Cholelithiasis without sonographic findings for acute
cholecystitis.
2. No biliary dilatation.
